# Patient Record
Sex: Male | Born: 1965 | Race: White | Hispanic: No | Marital: Married | Smoking: Former smoker
Health system: Southern US, Community
[De-identification: ages and names within clinical notes are randomized; demographics above are authoritative.]

## PROBLEM LIST (undated history)

## (undated) DIAGNOSIS — J45909 Unspecified asthma, uncomplicated: Secondary | ICD-10-CM

## (undated) DIAGNOSIS — J189 Pneumonia, unspecified organism: Secondary | ICD-10-CM

## (undated) DIAGNOSIS — Z8489 Family history of other specified conditions: Secondary | ICD-10-CM

## (undated) DIAGNOSIS — J329 Chronic sinusitis, unspecified: Secondary | ICD-10-CM

## (undated) DIAGNOSIS — K529 Noninfective gastroenteritis and colitis, unspecified: Secondary | ICD-10-CM

## (undated) DIAGNOSIS — E7800 Pure hypercholesterolemia, unspecified: Secondary | ICD-10-CM

## (undated) DIAGNOSIS — M1990 Unspecified osteoarthritis, unspecified site: Secondary | ICD-10-CM

## (undated) DIAGNOSIS — G8929 Other chronic pain: Secondary | ICD-10-CM

## (undated) DIAGNOSIS — G4730 Sleep apnea, unspecified: Secondary | ICD-10-CM

## (undated) HISTORY — PX: TONSILLECTOMY: SUR1361

## (undated) HISTORY — PX: VASECTOMY: SHX75

## (undated) HISTORY — PX: BACK SURGERY: SHX140

## (undated) HISTORY — PX: NASAL SINUS SURGERY: SHX719

## (undated) HISTORY — PX: CERVICAL FUSION: SHX112

---

## 2014-06-05 ENCOUNTER — Other Ambulatory Visit: Payer: Self-pay | Admitting: Neurosurgery

## 2014-07-07 ENCOUNTER — Encounter (HOSPITAL_COMMUNITY): Payer: Self-pay

## 2014-07-07 ENCOUNTER — Encounter (HOSPITAL_COMMUNITY)
Admission: RE | Admit: 2014-07-07 | Discharge: 2014-07-07 | Disposition: A | Discharge status: Home / Self Care | Payer: Managed Care, Other (non HMO) | Source: Ambulatory Visit | Attending: Neurosurgery | Admitting: Neurosurgery

## 2014-07-07 DIAGNOSIS — Z01812 Encounter for preprocedural laboratory examination: Secondary | ICD-10-CM | POA: Insufficient documentation

## 2014-07-07 DIAGNOSIS — Z01818 Encounter for other preprocedural examination: Secondary | ICD-10-CM | POA: Insufficient documentation

## 2014-07-07 HISTORY — DX: Pure hypercholesterolemia, unspecified: E78.00

## 2014-07-07 HISTORY — DX: Family history of other specified conditions: Z84.89

## 2014-07-07 HISTORY — DX: Noninfective gastroenteritis and colitis, unspecified: K52.9

## 2014-07-07 HISTORY — DX: Pneumonia, unspecified organism: J18.9

## 2014-07-07 HISTORY — DX: Unspecified asthma, uncomplicated: J45.909

## 2014-07-07 HISTORY — DX: Chronic sinusitis, unspecified: J32.9

## 2014-07-07 HISTORY — DX: Other chronic pain: G89.29

## 2014-07-07 HISTORY — DX: Sleep apnea, unspecified: G47.30

## 2014-07-07 HISTORY — DX: Unspecified osteoarthritis, unspecified site: M19.90

## 2014-07-07 LAB — CBC
HCT: 39.3 % (ref 39.0–52.0)
Hemoglobin: 12.6 g/dL — ABNORMAL LOW (ref 13.0–17.0)
MCH: 28.6 pg (ref 26.0–34.0)
MCHC: 32.1 g/dL (ref 30.0–36.0)
MCV: 89.3 fL (ref 78.0–100.0)
Platelets: 220 10*3/uL (ref 150–400)
RBC: 4.4 MIL/uL (ref 4.22–5.81)
RDW: 13.7 % (ref 11.5–15.5)
WBC: 6.3 10*3/uL (ref 4.0–10.5)

## 2014-07-07 LAB — BASIC METABOLIC PANEL
ANION GAP: 12 (ref 5–15)
BUN: 8 mg/dL (ref 6–23)
CHLORIDE: 101 meq/L (ref 96–112)
CO2: 29 mEq/L (ref 19–32)
Calcium: 9.1 mg/dL (ref 8.4–10.5)
Creatinine, Ser: 0.74 mg/dL (ref 0.50–1.35)
GFR calc non Af Amer: >90 mL/min (ref >90)
Glucose, Bld: 100 mg/dL — ABNORMAL HIGH (ref 70–99)
POTASSIUM: 3.9 meq/L (ref 3.7–5.3)
SODIUM: 142 meq/L (ref 137–147)

## 2014-07-07 LAB — SURGICAL PCR SCREEN
MRSA, PCR: NEGATIVE
STAPHYLOCOCCUS AUREUS: NEGATIVE

## 2014-07-07 NOTE — Pre-Procedure Instructions (Signed)
Miguel Lam  07/07/2014   Your procedure is scheduled on: Thursday, July 20, 2014  [facility] Admitting at 5:30 AM.  Call this number if you have problems the morning of surgery: [phone]   Remember:   Do not eat food or drink liquids after midnight Wednesday, July 19, 2014   Take these medicines the morning of surgery with A SIP OF WATER: DULoxetine (CYMBALTA),  pregabalin (LYRICA),  Stop taking Aspirin, vitamins, and herbal medications. Do not take any NSAIDs ie: Ibuprofen, Advil, Naproxen or any medication containing Aspirin; stop 5 days prior to procedure ( Sat. 07/15/14 ).  Do not wear jewelry, make-up or nail polish.  Do not wear lotions, powders, or perfumes. You may wear deodorant.  Do not shave 48 hours prior to surgery. Men may shave face and neck.  Do not bring valuables to the hospital.  [facility] not [facility] any belongings or valuables.               Contacts, dentures or bridgework may not be worn into surgery.  Leave suitcase in the car. After surgery it may be brought to your room.  For patients admitted to the hospital, discharge time is determined by your treatment team.               Patients discharged the day of surgery will not be allowed to drive home.  Name and phone number of your driver:   Special Instructions:  Special Instructions:Special Instructions: [facility] - Preparing for [facility]  Before surgery, you can play an important role.  Because skin is not sterile, your skin needs to be as free of germs as possible.  You can reduce the number of germs on you skin by washing with CHG (chlorahexidine gluconate) soap before surgery.  CHG is an antiseptic cleaner which kills germs and bonds with the skin to continue killing germs even after washing.  Please DO NOT use if you have an allergy to CHG or antibacterial soaps.  If your skin becomes reddened/irritated stop using the CHG and inform your nurse when you arrive at  Short Stay.  Do not shave (including legs and underarms) for at least 48 hours prior to the first CHG shower.  You may shave your face.  Please follow these instructions carefully:   1.  Shower with CHG Soap the night before surgery and the morning of Surgery.  2.  If you choose to wash your hair, wash your hair first as usual with your normal shampoo.  3.  After you shampoo, rinse your hair and body thoroughly to remove the Shampoo.  4.  Use CHG as you would any other liquid soap.  You can apply chg directly  to the skin and wash gently with scrungie or a clean washcloth.  5.  Apply the CHG Soap to your body ONLY FROM THE NECK DOWN.  Do not use on open wounds or open sores.  Avoid contact with your eyes, ears, mouth and genitals (private parts).  Wash genitals (private parts) with your normal soap.  6.  Wash thoroughly, paying special attention to the area where your surgery will be performed.  7.  Thoroughly rinse your body with warm water from the neck down.  8.  DO NOT shower/wash with your normal soap after using and rinsing off the CHG Soap.  9.  Pat yourself dry with a clean towel.  10.  Wear clean pajamas.            11.  Place clean sheets on your bed the night of your first shower and do not sleep with pets.  Day of Surgery  Do not apply any lotions the morning of surgery.  Please wear clean clothes to the hospital/surgery center.   Please read over the following fact sheets that you were given: Pain Booklet, Coughing and Deep Breathing, Blood Transfusion Information, MRSA Information and Surgical Site Infection Prevention

## 2014-07-07 NOTE — Progress Notes (Signed)
Pt denies SOB, chest pain, and being under the care of a cardiologist. Pt denies having a chest x ray and EKG within the last year. Pt denies having a stress test, echo and cardiac cath. Pt PCP ids Dr. Nickolas MadridSteven [place] in [place].

## 2014-07-19 MED ORDER — CEFAZOLIN SODIUM-DEXTROSE 2-3 GM-% IV SOLR
2.0000 g | INTRAVENOUS | Status: DC
  Filled 2014-07-19: qty 50

## 2014-07-19 MED ORDER — DEXAMETHASONE SODIUM PHOSPHATE 10 MG/ML IJ SOLN: 10.0000 mg | INTRAMUSCULAR | Status: DC

## 2014-07-20 ENCOUNTER — Encounter (HOSPITAL_COMMUNITY)
Admission: RE | Disposition: A | Discharge status: Home / Self Care | Payer: Self-pay | Source: Ambulatory Visit | Attending: Neurosurgery

## 2014-07-20 ENCOUNTER — Encounter (HOSPITAL_COMMUNITY): Payer: Self-pay | Admitting: *Deleted

## 2014-07-20 ENCOUNTER — Inpatient Hospital Stay (HOSPITAL_COMMUNITY): Payer: Managed Care, Other (non HMO) | Admitting: Anesthesiology

## 2014-07-20 ENCOUNTER — Encounter (HOSPITAL_COMMUNITY): Payer: Managed Care, Other (non HMO) | Admitting: Anesthesiology

## 2014-07-20 ENCOUNTER — Inpatient Hospital Stay (HOSPITAL_COMMUNITY)
Admission: RE | Admit: 2014-07-20 | Discharge: 2014-07-20 | DRG: 460 | Disposition: A | Discharge status: Home / Self Care | Payer: Managed Care, Other (non HMO) | Source: Ambulatory Visit | Attending: Neurosurgery | Admitting: Neurosurgery

## 2014-07-20 ENCOUNTER — Inpatient Hospital Stay (HOSPITAL_COMMUNITY): Payer: Managed Care, Other (non HMO)

## 2014-07-20 DIAGNOSIS — Z825 Family history of asthma and other chronic lower respiratory diseases: Secondary | ICD-10-CM

## 2014-07-20 DIAGNOSIS — G8929 Other chronic pain: Secondary | ICD-10-CM | POA: Diagnosis present

## 2014-07-20 DIAGNOSIS — M533 Sacrococcygeal disorders, not elsewhere classified: Principal | ICD-10-CM | POA: Diagnosis present

## 2014-07-20 DIAGNOSIS — Z9852 Vasectomy status: Secondary | ICD-10-CM

## 2014-07-20 DIAGNOSIS — Z87891 Personal history of nicotine dependence: Secondary | ICD-10-CM

## 2014-07-20 DIAGNOSIS — Z79899 Other long term (current) drug therapy: Secondary | ICD-10-CM

## 2014-07-20 DIAGNOSIS — G4730 Sleep apnea, unspecified: Secondary | ICD-10-CM | POA: Diagnosis present

## 2014-07-20 DIAGNOSIS — Z833 Family history of diabetes mellitus: Secondary | ICD-10-CM

## 2014-07-20 DIAGNOSIS — Z981 Arthrodesis status: Secondary | ICD-10-CM

## 2014-07-20 DIAGNOSIS — Z8249 Family history of ischemic heart disease and other diseases of the circulatory system: Secondary | ICD-10-CM

## 2014-07-20 DIAGNOSIS — E7800 Pure hypercholesterolemia, unspecified: Secondary | ICD-10-CM | POA: Diagnosis present

## 2014-07-20 HISTORY — PX: LUMBAR FUSION: SHX111

## 2014-07-20 SURGERY — LUMBAR FACET FUSION
Anesthesia: General | Site: Back | Laterality: Left

## 2014-07-20 MED ORDER — CYCLOBENZAPRINE HCL 10 MG PO TABS: 10.0000 mg | ORAL_TABLET | Freq: Three times a day (TID) | ORAL | Status: DC | PRN

## 2014-07-20 MED ORDER — HYDROMORPHONE HCL PF 1 MG/ML IJ SOLN
INTRAMUSCULAR | Status: AC
  Filled 2014-07-20: qty 1

## 2014-07-20 MED ORDER — HYDROMORPHONE HCL PF 1 MG/ML IJ SOLN: 1.0000 mg | INTRAMUSCULAR | Status: DC | PRN

## 2014-07-20 MED ORDER — ROCURONIUM BROMIDE 50 MG/5ML IV SOLN
INTRAVENOUS | Status: AC
  Filled 2014-07-20: qty 1

## 2014-07-20 MED ORDER — LIDOCAINE HCL (CARDIAC) 20 MG/ML IV SOLN
INTRAVENOUS | Status: AC
  Filled 2014-07-20: qty 5

## 2014-07-20 MED ORDER — CYCLOBENZAPRINE HCL 10 MG PO TABS: 10.0000 mg | ORAL_TABLET | Freq: Three times a day (TID) | ORAL | Status: AC | PRN

## 2014-07-20 MED ORDER — DEXMEDETOMIDINE HCL IN NACL 200 MCG/50ML IV SOLN
INTRAVENOUS | Status: DC | PRN
  Administered 2014-07-20: 0.3 ug/kg/h via INTRAVENOUS

## 2014-07-20 MED ORDER — PREGABALIN 100 MG PO CAPS: 300.0000 mg | ORAL_CAPSULE | Freq: Every day | ORAL | Status: DC

## 2014-07-20 MED ORDER — OXYCODONE-ACETAMINOPHEN 5-325 MG PO TABS
1.0000 | ORAL_TABLET | ORAL | Status: DC | PRN
Start: 2014-07-20 — End: 2014-07-20
  Administered 2014-07-20: 2 via ORAL
  Filled 2014-07-20: qty 2

## 2014-07-20 MED ORDER — CYCLOBENZAPRINE HCL 10 MG PO TABS
10.0000 mg | ORAL_TABLET | Freq: Three times a day (TID) | ORAL | Status: DC | PRN
  Administered 2014-07-20: 10 mg via ORAL
  Filled 2014-07-20: qty 1

## 2014-07-20 MED ORDER — DEXMEDETOMIDINE HCL IN NACL 200 MCG/50ML IV SOLN
INTRAVENOUS | Status: AC
  Filled 2014-07-20: qty 50

## 2014-07-20 MED ORDER — ACETAMINOPHEN 325 MG PO TABS: 650.0000 mg | ORAL_TABLET | ORAL | Status: DC | PRN

## 2014-07-20 MED ORDER — PANTOPRAZOLE SODIUM 40 MG IV SOLR
40.0000 mg | Freq: Every day | INTRAVENOUS | Status: DC
  Filled 2014-07-20: qty 40

## 2014-07-20 MED ORDER — OXYCODONE-ACETAMINOPHEN 5-325 MG PO TABS: 1.0000 | ORAL_TABLET | ORAL | Status: AC | PRN

## 2014-07-20 MED ORDER — NEOSTIGMINE METHYLSULFATE 10 MG/10ML IV SOLN
INTRAVENOUS | Status: AC
  Filled 2014-07-20: qty 1

## 2014-07-20 MED ORDER — GLYCOPYRROLATE 0.2 MG/ML IJ SOLN
INTRAMUSCULAR | Status: AC
  Filled 2014-07-20: qty 2

## 2014-07-20 MED ORDER — HYDROMORPHONE HCL PF 1 MG/ML IJ SOLN
0.2500 mg | INTRAMUSCULAR | Status: DC | PRN
  Administered 2014-07-20: 0.5 mg via INTRAVENOUS

## 2014-07-20 MED ORDER — KCL IN DEXTROSE-NACL 20-5-0.45 MEQ/L-%-% IV SOLN
80.0000 mL/h | INTRAVENOUS | Status: DC
  Filled 2014-07-20 (×2): qty 1000

## 2014-07-20 MED ORDER — PROPOFOL 10 MG/ML IV BOLUS
INTRAVENOUS | Status: AC
  Filled 2014-07-20: qty 20

## 2014-07-20 MED ORDER — ONDANSETRON HCL 4 MG/2ML IJ SOLN
INTRAMUSCULAR | Status: AC
  Filled 2014-07-20: qty 2

## 2014-07-20 MED ORDER — THROMBIN 5000 UNITS EX SOLR
CUTANEOUS | Status: DC | PRN
  Administered 2014-07-20 (×2): 5000 [IU] via TOPICAL

## 2014-07-20 MED ORDER — OXYCODONE HCL 5 MG PO TABS: 5.0000 mg | ORAL_TABLET | Freq: Once | ORAL | Status: DC | PRN

## 2014-07-20 MED ORDER — MENTHOL 3 MG MT LOZG: 1.0000 | LOZENGE | OROMUCOSAL | Status: DC | PRN

## 2014-07-20 MED ORDER — HEMOSTATIC AGENTS (NO CHARGE) OPTIME
TOPICAL | Status: DC | PRN
  Administered 2014-07-20: 1 via TOPICAL

## 2014-07-20 MED ORDER — DEXAMETHASONE SODIUM PHOSPHATE 4 MG/ML IJ SOLN
INTRAMUSCULAR | Status: AC
  Filled 2014-07-20: qty 3

## 2014-07-20 MED ORDER — CEFAZOLIN SODIUM-DEXTROSE 2-3 GM-% IV SOLR
2.0000 g | Freq: Three times a day (TID) | INTRAVENOUS | Status: DC
  Administered 2014-07-20: 2 g via INTRAVENOUS
  Filled 2014-07-20 (×2): qty 50

## 2014-07-20 MED ORDER — PHENOL 1.4 % MT LIQD: 1.0000 | OROMUCOSAL | Status: DC | PRN

## 2014-07-20 MED ORDER — OXYCODONE-ACETAMINOPHEN 5-325 MG PO TABS: 1.0000 | ORAL_TABLET | ORAL | Status: DC | PRN

## 2014-07-20 MED ORDER — FENTANYL 25 MCG/HR TD PT72: 100.0000 ug | MEDICATED_PATCH | TRANSDERMAL | Status: DC

## 2014-07-20 MED ORDER — FENTANYL CITRATE 0.05 MG/ML IJ SOLN
INTRAMUSCULAR | Status: DC | PRN
  Administered 2014-07-20: 100 ug via INTRAVENOUS

## 2014-07-20 MED ORDER — SODIUM CHLORIDE 0.9 % IJ SOLN
3.0000 mL | Freq: Two times a day (BID) | INTRAMUSCULAR | Status: DC
  Administered 2014-07-20: 3 mL via INTRAVENOUS

## 2014-07-20 MED ORDER — LACTATED RINGERS IV SOLN
INTRAVENOUS | Status: DC | PRN
  Administered 2014-07-20: 07:00:00 via INTRAVENOUS

## 2014-07-20 MED ORDER — SODIUM CHLORIDE 0.9 % IR SOLN
Status: DC | PRN
  Administered 2014-07-20: 08:00:00

## 2014-07-20 MED ORDER — BUPIVACAINE HCL (PF) 0.25 % IJ SOLN
INTRAMUSCULAR | Status: DC | PRN
  Administered 2014-07-20: 20 mL

## 2014-07-20 MED ORDER — MIDAZOLAM HCL 5 MG/5ML IJ SOLN
INTRAMUSCULAR | Status: DC | PRN
  Administered 2014-07-20: 2 mg via INTRAVENOUS

## 2014-07-20 MED ORDER — ACETAMINOPHEN 650 MG RE SUPP: 650.0000 mg | RECTAL | Status: DC | PRN

## 2014-07-20 MED ORDER — TRAZODONE HCL 100 MG PO TABS
100.0000 mg | ORAL_TABLET | Freq: Every day | ORAL | Status: DC
  Filled 2014-07-20: qty 1

## 2014-07-20 MED ORDER — ONDANSETRON HCL 4 MG/2ML IJ SOLN: 4.0000 mg | Freq: Once | INTRAMUSCULAR | Status: DC | PRN

## 2014-07-20 MED ORDER — DEXAMETHASONE SODIUM PHOSPHATE 4 MG/ML IJ SOLN
INTRAMUSCULAR | Status: AC
  Filled 2014-07-20: qty 2

## 2014-07-20 MED ORDER — ONDANSETRON HCL 4 MG/2ML IJ SOLN
INTRAMUSCULAR | Status: DC | PRN
  Administered 2014-07-20: 4 mg via INTRAVENOUS

## 2014-07-20 MED ORDER — DULOXETINE HCL 60 MG PO CPEP
60.0000 mg | ORAL_CAPSULE | Freq: Two times a day (BID) | ORAL | Status: DC
  Filled 2014-07-20: qty 1

## 2014-07-20 MED ORDER — DEXAMETHASONE SODIUM PHOSPHATE 4 MG/ML IJ SOLN
INTRAMUSCULAR | Status: DC | PRN
  Administered 2014-07-20: 8 mg via INTRAVENOUS

## 2014-07-20 MED ORDER — ONDANSETRON HCL 4 MG/2ML IJ SOLN: 4.0000 mg | INTRAMUSCULAR | Status: DC | PRN

## 2014-07-20 MED ORDER — KETAMINE HCL 100 MG/ML IJ SOLN
INTRAMUSCULAR | Status: DC | PRN
  Administered 2014-07-20: 20 mg via INTRAVENOUS

## 2014-07-20 MED ORDER — NEOSTIGMINE METHYLSULFATE 10 MG/10ML IV SOLN
INTRAVENOUS | Status: DC | PRN
  Administered 2014-07-20: 3 mg via INTRAVENOUS

## 2014-07-20 MED ORDER — FENTANYL CITRATE 0.05 MG/ML IJ SOLN
INTRAMUSCULAR | Status: AC
  Filled 2014-07-20: qty 5

## 2014-07-20 MED ORDER — KETAMINE HCL 100 MG/ML IJ SOLN
INTRAMUSCULAR | Status: AC
  Filled 2014-07-20: qty 1

## 2014-07-20 MED ORDER — DOCUSATE SODIUM 100 MG PO CAPS
100.0000 mg | ORAL_CAPSULE | Freq: Two times a day (BID) | ORAL | Status: DC
  Filled 2014-07-20 (×2): qty 1

## 2014-07-20 MED ORDER — OXYCODONE HCL 5 MG/5ML PO SOLN
5.0000 mg | Freq: Once | ORAL | Status: DC | PRN
Start: 2014-07-20 — End: 2014-07-20

## 2014-07-20 MED ORDER — GLYCOPYRROLATE 0.2 MG/ML IJ SOLN
INTRAMUSCULAR | Status: DC | PRN
  Administered 2014-07-20: 0.4 mg via INTRAVENOUS

## 2014-07-20 MED ORDER — FLUTICASONE PROPIONATE 50 MCG/ACT NA SUSP
2.0000 | Freq: Every day | NASAL | Status: DC | PRN
  Filled 2014-07-20: qty 16

## 2014-07-20 MED ORDER — LIDOCAINE HCL (CARDIAC) 20 MG/ML IV SOLN
INTRAVENOUS | Status: DC | PRN
  Administered 2014-07-20: 70 mg via INTRAVENOUS

## 2014-07-20 MED ORDER — ROCURONIUM BROMIDE 100 MG/10ML IV SOLN
INTRAVENOUS | Status: DC | PRN
  Administered 2014-07-20: 50 mg via INTRAVENOUS

## 2014-07-20 MED ORDER — PROPOFOL 10 MG/ML IV BOLUS
INTRAVENOUS | Status: DC | PRN
  Administered 2014-07-20: 160 mg via INTRAVENOUS

## 2014-07-20 MED ORDER — SODIUM CHLORIDE 0.9 % IJ SOLN: 3.0000 mL | INTRAMUSCULAR | Status: DC | PRN

## 2014-07-20 MED ORDER — MIDAZOLAM HCL 2 MG/2ML IJ SOLN
INTRAMUSCULAR | Status: AC
  Filled 2014-07-20: qty 2

## 2014-07-20 MED ORDER — 0.9 % SODIUM CHLORIDE (POUR BTL) OPTIME
TOPICAL | Status: DC | PRN
  Administered 2014-07-20: 1000 mL

## 2014-07-20 SURGICAL SUPPLY — 60 items
BAG DECANTER FOR FLEXI CONT (MISCELLANEOUS) ×3 IMPLANT
BENZOIN TINCTURE PRP APPL 2/3 (GAUZE/BANDAGES/DRESSINGS) ×6 IMPLANT
BLADE 10 SAFETY STRL DISP (BLADE) ×3 IMPLANT
BLADE SURG ROTATE 9660 (MISCELLANEOUS) IMPLANT
BRUSH SCRUB EZ PLAIN DRY (MISCELLANEOUS) ×3 IMPLANT
CLOSURE WOUND 1/2 X4 (GAUZE/BANDAGES/DRESSINGS) ×1
CONT SPEC 4OZ CLIKSEAL STRL BL (MISCELLANEOUS) IMPLANT
COVER BACK TABLE 24X17X13 BIG (DRAPES) IMPLANT
COVER TABLE BACK 60X90 (DRAPES) ×3 IMPLANT
DERMABOND ADVANCED (GAUZE/BANDAGES/DRESSINGS) ×2
DERMABOND ADVANCED .7 DNX12 (GAUZE/BANDAGES/DRESSINGS) ×1 IMPLANT
DRAPE C-ARM 42X72 X-RAY (DRAPES) ×3 IMPLANT
DRAPE C-ARMOR (DRAPES) ×3 IMPLANT
DRAPE LAPAROTOMY 100X72X124 (DRAPES) ×3 IMPLANT
DRAPE SURG 17X23 STRL (DRAPES) ×6 IMPLANT
DRSG OPSITE POSTOP 4X6 (GAUZE/BANDAGES/DRESSINGS) ×3 IMPLANT
DRSG TELFA 3X8 NADH (GAUZE/BANDAGES/DRESSINGS) ×3 IMPLANT
ELECT BLADE 4.0 EZ CLEAN MEGAD (MISCELLANEOUS) ×3
ELECT REM PT RETURN 9FT ADLT (ELECTROSURGICAL) ×3
ELECTRODE BLDE 4.0 EZ CLN MEGD (MISCELLANEOUS) ×1 IMPLANT
ELECTRODE REM PT RTRN 9FT ADLT (ELECTROSURGICAL) ×1 IMPLANT
GAUZE SPONGE 4X4 16PLY XRAY LF (GAUZE/BANDAGES/DRESSINGS) IMPLANT
GLOVE BIOGEL PI IND STRL 7.0 (GLOVE) ×2 IMPLANT
GLOVE BIOGEL PI IND STRL 7.5 (GLOVE) ×1 IMPLANT
GLOVE BIOGEL PI INDICATOR 7.0 (GLOVE) ×4
GLOVE BIOGEL PI INDICATOR 7.5 (GLOVE) ×2
GLOVE ECLIPSE 7.0 STRL STRAW (GLOVE) ×3 IMPLANT
GLOVE ECLIPSE 8.0 STRL XLNG CF (GLOVE) ×3 IMPLANT
GLOVE EXAM NITRILE LRG STRL (GLOVE) IMPLANT
GLOVE EXAM NITRILE XL STR (GLOVE) IMPLANT
GLOVE EXAM NITRILE XS STR PU (GLOVE) IMPLANT
GLOVE OPTIFIT SS 6.5 STRL BRWN (GLOVE) ×9 IMPLANT
GLOVE SS N UNI LF 7.0 STRL (GLOVE) ×9 IMPLANT
GOWN STRL REUS W/ TWL LRG LVL3 (GOWN DISPOSABLE) ×4 IMPLANT
GOWN STRL REUS W/ TWL XL LVL3 (GOWN DISPOSABLE) ×1 IMPLANT
GOWN STRL REUS W/TWL 2XL LVL3 (GOWN DISPOSABLE) IMPLANT
GOWN STRL REUS W/TWL LRG LVL3 (GOWN DISPOSABLE) ×8
GOWN STRL REUS W/TWL XL LVL3 (GOWN DISPOSABLE) ×2
KIT BASIN OR (CUSTOM PROCEDURE TRAY) ×3 IMPLANT
KIT ROOM TURNOVER OR (KITS) ×3 IMPLANT
NEEDLE HYPO 22GX1.5 SAFETY (NEEDLE) ×3 IMPLANT
NS IRRIG 1000ML POUR BTL (IV SOLUTION) ×3 IMPLANT
PACK LAMINECTOMY NEURO (CUSTOM PROCEDURE TRAY) ×3 IMPLANT
PIN EXCHANGE 500MM (PIN) ×3 IMPLANT
PIN STEINMAN TROCAR 300MM (PIN) ×3 IMPLANT
PUTTY BONE GRAFT KIT 2.5ML (Bone Implant) ×3 IMPLANT
SCREW CANN FUS SI JT 12.5X50 (Screw) ×3 IMPLANT
SCREW CANN FUS SI JT 7X40 (Screw) ×3 IMPLANT
SCREW CANN SI JT FUS 12.5X45 (Screw) ×3 IMPLANT
SPONGE GAUZE 4X4 12PLY (GAUZE/BANDAGES/DRESSINGS) ×3 IMPLANT
SPONGE LAP 4X18 X RAY DECT (DISPOSABLE) IMPLANT
SPONGE SURGIFOAM ABS GEL SZ50 (HEMOSTASIS) IMPLANT
STRIP CLOSURE SKIN 1/2X4 (GAUZE/BANDAGES/DRESSINGS) ×2 IMPLANT
SUT VIC AB 2-0 OS6 18 (SUTURE) ×21 IMPLANT
SUT VIC AB 3-0 CP2 18 (SUTURE) ×9 IMPLANT
SYR 20ML ECCENTRIC (SYRINGE) ×3 IMPLANT
TOWEL OR 17X24 6PK STRL BLUE (TOWEL DISPOSABLE) ×3 IMPLANT
TOWEL OR 17X26 10 PK STRL BLUE (TOWEL DISPOSABLE) ×3 IMPLANT
TRAY FOLEY CATH 14FRSI W/METER (CATHETERS) IMPLANT
WATER STERILE IRR 1000ML POUR (IV SOLUTION) ×3 IMPLANT

## 2014-07-20 NOTE — Op Note (Signed)
Preop diagnosis: Left sacroiliac joint dysfunction Postop diagnosis: Same Procedure: Left sacroiliac joint fusion with TriCor system Surgeon: Fernie Grimm  After and placed in the prone position the patient's left buttock region was prepped and draped in the usual sterile fashion. Fluoroscopy B. was used to identify the posterior aspect of the sacrum and the alar line. We then made an incision parallel with the posterior aspect of the sacrum about 1 cm below it and starting 1 cm behind the alar line. Hemostasis was controlled unipolar coagulation. We then placed our first Steinmann pin and engaged the ilium. We followed it into good position and lateral inlet and outlet fluoroscopic views until was in excellent position. We then did sequential dilation through the soft tissue and then drilled a canal over the Steinmann pin. We then tapped and then placed a 45 mm both filled with a mixture of autologous bone morselized allograft. We removed the dilators then used our Steinmann pin to guide us to her second entry point we placed a second Steinmann pin without difficulty. We repeated the process at this level and placed a 50 mm both filled with a mixture of autologous bone and morselized allograft. This once again followed in excellent position and lateral inlet and outlet view fluoroscopy. We then placed our smaller bolt in standard fashion and used a 40 mm length graft for this. Final fluoroscopy in inlet and outlet views and lateral showed excellent placement of the instrumentation. We irrigated the wound copiously and closed it in multiple layers of Vicryl on the subcutaneous and subcuticular tissues. Dermabond Steri-Strips were used on the skin. A sterile dressing was then applied and the patient was extubated and taken to cut them in stable condition.

## 2014-07-20 NOTE — Discharge Instructions (Signed)
Wound Care Keep incision covered and dry for one week.. You may remove outer bandage after one week.  Do not put any creams, lotions, or ointments on incision. Leave steri-strips on hip.  They will fall off by themselves. Activity Walk each and every day, increasing distance each day. No lifting greater than 5 lbs.  Avoid bending, arching, and twisting. No driving or riding in car until further notice at follow up appointment.  Diet Resume your normal diet.  Return to Work Will be discussed at you follow up appointment. Call Your Doctor If Any of These Occur Redness, drainage, or swelling at the wound.  Temperature greater than 101 degrees. Severe pain not relieved by pain medication. Incision starts to come apart. Follow Up Appt Call today for appointment in 1-2 weeks [phone]) or for problems.  If you have any hardware placed in your spine, you will need an x-ray before your appointment.

## 2014-07-20 NOTE — Transfer of Care (Signed)
Immediate Anesthesia Transfer of Care Note  Patient: Miguel DavenportMichael G Lam  Procedure(s) Performed: Procedure(s) with comments: SI Joint Fusion (Left) - SI Joint Fusion  Patient Location: PACU  Anesthesia Type:General  Level of Consciousness: awake, alert , oriented and patient cooperative  Airway & Oxygen Therapy: Patient Spontanous Breathing and Patient connected to nasal cannula oxygen  Post-op Assessment: Report given to PACU RN and Post -op Vital signs reviewed and stable  Post vital signs: Reviewed and stable  Complications: No apparent anesthesia complications

## 2014-07-20 NOTE — Plan of Care (Signed)
Problem: Consults Goal: Diagnosis - Spinal Surgery Outcome: Completed/Met Date Met:  07/20/14 SACROILIAC JOINT FUSION

## 2014-07-20 NOTE — H&P (Signed)
Miguel Lam is an 48 y.o. male.   Chief Complaint: Left lower back and buttock pain HPI: The patient is a 48 year old gentleman who was evaluated in the office for left lower back and buttock pain. He's had problems for years. 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 He got some improvement but still had this pain. It is persistent and progressively worse during the last year. His wrist is asymptomatic. An MRI scan was done which was unremarkable and now comes for evaluation for possible SI joint dysfunction. He is a focal injections including radiofrequency broad-based. After evaluation in the office the patient underwent SI joint injection which gave him temporary relief only. This was actually done twice with the same response. After discussing the options the patient requested surgery and now comes for an SI joint fusion on the left side. I've had a long discussion with him regarding the risks and benefits of surgical intervention. The risks discussed include but are not limited to bleeding infection weakness numbness paralysis trouble with instrumentation coma and death. We've discussed alternative methods of therapy although risks and benefits of nonintervention. He's had the opportunity to ask numerous questions and appears to understand. With this information in hand he has requested that we proceed with surgery.  Past Medical History  Diagnosis Date  . Family history of anesthesia complication     "Had trouble waking grandmother up.'  . Chronic pain   . Colitis     PMH  . Hypercholesterolemia   . Asthma     "as a child"  . Recurrent sinus infections   . Sleep apnea   . Pneumonia   . Arthritis     Past Surgical History  Procedure Laterality Date  . Tonsillectomy    . Vasectomy    . Cervical fusion    . Nasal sinus surgery    . Back surgery      Family History  Problem Relation Age of Onset  . Heart disease Father   . Asthma Other   . Cancer - Colon Other   .  Diabetes Other   . Obesity Other    Social History:  reports that he has quit smoking. His smoking use included Cigarettes. He smoked 0.00 packs per day. He has never used smokeless tobacco. He reports that he does not drink alcohol or use illicit drugs.  Allergies: Not on File  Medications Prior to Admission  Medication Sig Dispense Refill  . DULoxetine (CYMBALTA) 60 MG capsule Take 60 mg by mouth 2 (two) times daily. For 90 days      . fentaNYL (DURAGESIC - DOSED MCG/HR) 100 MCG/HR Place 100 mcg onto the skin every other day.      . fluticasone (FLONASE) 50 MCG/ACT nasal spray Place 2 sprays into both nostrils daily as needed for allergies or rhinitis.      . Niacin, Antihyperlipidemic, (NIASPAN PO) Take 1 tablet by mouth daily.       . pregabalin (LYRICA) 100 MG capsule Take 300 mg by mouth daily.      . pregabalin (LYRICA) 150 MG capsule Take 150 mg by mouth 2 (two) times daily.      . simvastatin (ZOCOR) 20 MG tablet Take 20 mg by mouth daily at 6 PM.      . Testosterone (ANDROGEL PUMP TD) Place 1 application onto the skin daily. Per shoulder      . tiZANidine (ZANAFLEX) 4 MG tablet Take 4 mg by mouth 4 (four)  times daily.      . traZODone (DESYREL) 100 MG tablet Take 100 mg by mouth at bedtime. For 30 days      . diphenhydramine-acetaminophen (TYLENOL PM) 25-500 MG TABS Take 2 tablets by mouth at bedtime as needed (for sleep and pain).        No results found for this or any previous visit (from the past 48 hour(s)). No results found.  Positive for sinus issues  Blood pressure 111/70, pulse 53, temperature 98.6 F (37 C), temperature source Oral, resp. rate 18, weight 81.194 kg (179 lb), SpO2 97.00%.  The patient is awake alert and oriented. His no facial asymmetry. His 3+ knee jerks and 2+ ankle jerks. Strength is 5 over 5 and sensation is intact. He has positive tenderness over the left SI joint as well as positive provocative testing with compression of the knee down to the SI  joint. Assessment/Plan Impression is that of left SI joint dysfunction. The plan is for a left SI joint fusion.  Reinaldo MeekerKRITZER,Masyn Fullam O, MD 07/20/2014, 7:32 AM

## 2014-07-20 NOTE — Anesthesia Preprocedure Evaluation (Signed)
Anesthesia Evaluation  Patient identified by MRN, date of birth, ID band Patient awake    Reviewed: Allergy & Precautions, H&P , NPO status , Patient's Chart, lab work & pertinent test results  Airway Mallampati: II TM Distance: >3 FB Neck ROM: Full    Dental  (+) Teeth Intact, Dental Advisory Given   Pulmonary former smoker,  breath sounds clear to auscultation        Cardiovascular Rhythm:Regular Rate:Normal     Neuro/Psych    GI/Hepatic   Endo/Other    Renal/GU      Musculoskeletal   Abdominal   Peds  Hematology   Anesthesia Other Findings   Reproductive/Obstetrics                           Anesthesia Physical Anesthesia Plan  ASA: II  Anesthesia Plan: General   Post-op Pain Management:    Induction: Intravenous  Airway Management Planned: Oral ETT  Additional Equipment:   Intra-op Plan:   Post-operative Plan: Extubation in OR  Informed Consent: I have reviewed the patients History and Physical, chart, labs and discussed the procedure including the risks, benefits and alternatives for the proposed anesthesia with the patient or authorized representative who has indicated his/her understanding and acceptance.   Dental advisory given  Plan Discussed with: CRNA and Anesthesiologist  Anesthesia Plan Comments: (Chronic low back pain on long term narcotics H/O Cervical spondylosis   Plan GA with oral ETT  Kipp Broodavid Arek Spadafore, MD)        Anesthesia Quick Evaluation

## 2014-07-20 NOTE — Progress Notes (Signed)
Orthopedic Tech Progress Note Patient Details:  Miguel DavenportMichael G Lam 11/08/1966 [id number]  Ortho Devices Type of Ortho Device: Crutches Ortho Device/Splint Interventions: Application   Shawnie PonsCammer, Miguel Lam 07/20/2014, 12:45 PM

## 2014-07-20 NOTE — Progress Notes (Signed)
Patient alert and oriented, mae's well, voiding adequate amount of urine, swallowing without difficulty, no c/o pain. Patient discharged home with family. Script and discharged instructions given to patient. Patient and family stated understanding of instructions given.  

## 2014-07-20 NOTE — Discharge Summary (Signed)
  Physician Discharge Summary  Patient ID: Miguel Lam MRN: [id number] DOB/AGE: [birth year] 48 y.o.  Admit date: 07/20/2014 Discharge date: 07/20/2014  Admission Diagnoses: SI joint dysfunction  Discharge Diagnoses: Same Active Problems:   Sacroiliac joint dysfunction   Discharged Condition: Stable  Hospital Course:  Mrs. Miguel DavenportMichael G Myhand is a 48 y.o. male electively admitted after uncomplicated left SI joint fusion. Postop he was ambulating with crutches and non-weight bearing on the left. He is voiding normally and tolerating diet.  Treatments: Surgery - Left SI joint fusion  Discharge Exam: Blood pressure 125/83, pulse 72, temperature 97.7 F (36.5 C), temperature source Oral, resp. rate 26, weight 81.194 kg (179 lb), SpO2 96.00%. Awake, alert, oriented Speech fluent, appropriate CN grossly intact 5/5 BUE/BLE Wound c/d/i  Follow-up: Follow-up in Dr. Trudee GripKritzer's office [facility] [phone]) in 2-3 weeks  Disposition: Home     Medication List         ANDROGEL PUMP TD  Place 1 application onto the skin daily. Per shoulder     cyclobenzaprine 10 MG tablet  Commonly known as:  FLEXERIL  Take 1 tablet (10 mg total) by mouth 3 (three) times daily as needed for muscle spasms.     diphenhydramine-acetaminophen 25-500 MG Tabs  Commonly known as:  TYLENOL PM  Take 2 tablets by mouth at bedtime as needed (for sleep and pain).     DULoxetine 60 MG capsule  Commonly known as:  CYMBALTA  Take 60 mg by mouth 2 (two) times daily. For 90 days     fentaNYL 100 MCG/HR  Commonly known as:  DURAGESIC - dosed mcg/hr  Place 100 mcg onto the skin every other day.     fluticasone 50 MCG/ACT nasal spray  Commonly known as:  FLONASE  Place 2 sprays into both nostrils daily as needed for allergies or rhinitis.     NIASPAN PO  Take 1 tablet by mouth daily.     oxyCODONE-acetaminophen 5-325 MG per tablet  Commonly known as:  PERCOCET/ROXICET  Take 1-2  tablets by mouth every 4 (four) hours as needed for moderate pain.     pregabalin 100 MG capsule  Commonly known as:  LYRICA  Take 300 mg by mouth daily.     pregabalin 150 MG capsule  Commonly known as:  LYRICA  Take 150 mg by mouth 2 (two) times daily.     simvastatin 20 MG tablet  Commonly known as:  ZOCOR  Take 20 mg by mouth daily at 6 PM.     tiZANidine 4 MG tablet  Commonly known as:  ZANAFLEX  Take 4 mg by mouth 4 (four) times daily.     traZODone 100 MG tablet  Commonly known as:  DESYREL  Take 100 mg by mouth at bedtime. For 30 days         Signed: Lisbeth RenshawUNDKUMAR, Cire Deyarmin, C 07/20/2014, 4:52 PM

## 2014-07-20 NOTE — Anesthesia Postprocedure Evaluation (Signed)
  Anesthesia Post-op Note  Patient: Miguel Lam  Procedure(s) Performed: Procedure(s) with comments: SI Joint Fusion (Left) - SI Joint Fusion  Patient Location: PACU  Anesthesia Type:General  Level of Consciousness: awake, alert  and oriented  Airway and Oxygen Therapy: Patient Spontanous Breathing and Patient connected to nasal cannula oxygen  Post-op Pain: mild  Post-op Assessment: Post-op Vital signs reviewed, Patient's Cardiovascular Status Stable, Respiratory Function Stable, Patent Airway and Pain level controlled  Post-op Vital Signs: stable  Last Vitals:  Filed Vitals:   07/20/14 1050  BP: 125/83  Pulse: 72  Temp: 36.5 C  Resp:     Complications: No apparent anesthesia complications

## 2014-07-24 ENCOUNTER — Encounter (HOSPITAL_COMMUNITY): Payer: Self-pay | Admitting: Neurosurgery

## 2014-07-31 ENCOUNTER — Encounter (HOSPITAL_COMMUNITY): Payer: Self-pay | Admitting: Neurosurgery

## 2015-05-15 IMAGING — RF DG C-ARM 61-120 MIN
1 series · 4 of 4 positions shown · IV contrast (agent unspecified)
Comparison: None.

CLINICAL DATA: Left SI joint effusion.

EXAM:
DG C-ARM 61-120 MIN; BILATERAL SACROILIAC JOINTS - 3+ VIEW
TECHNIQUE: Four spot fluoroscopic intraoperative films over the sacrum.
CONTRAST:  None.
FLUOROSCOPY TIME:  2 min 54 seconds.

[Series 1: run · 4 of 4 slices shown]
[im 1/4]
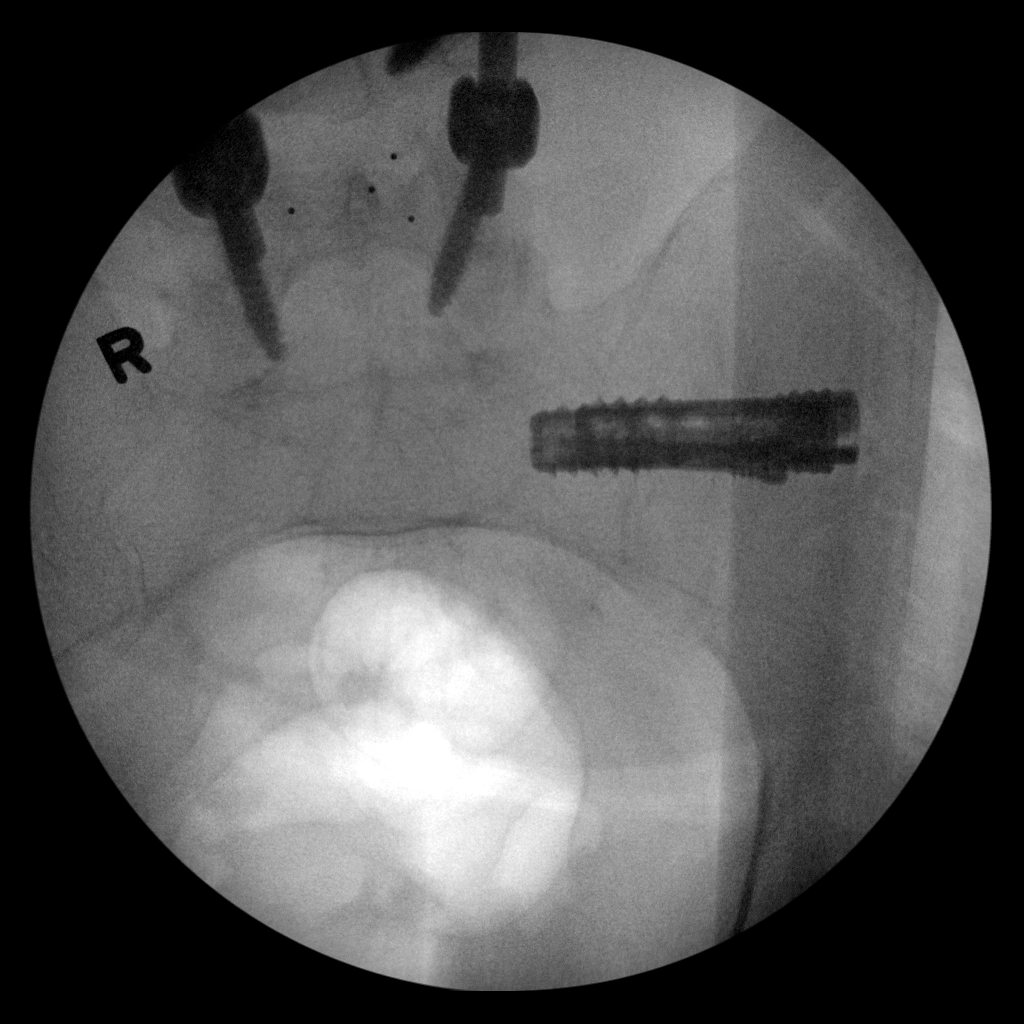
[im 2/4]
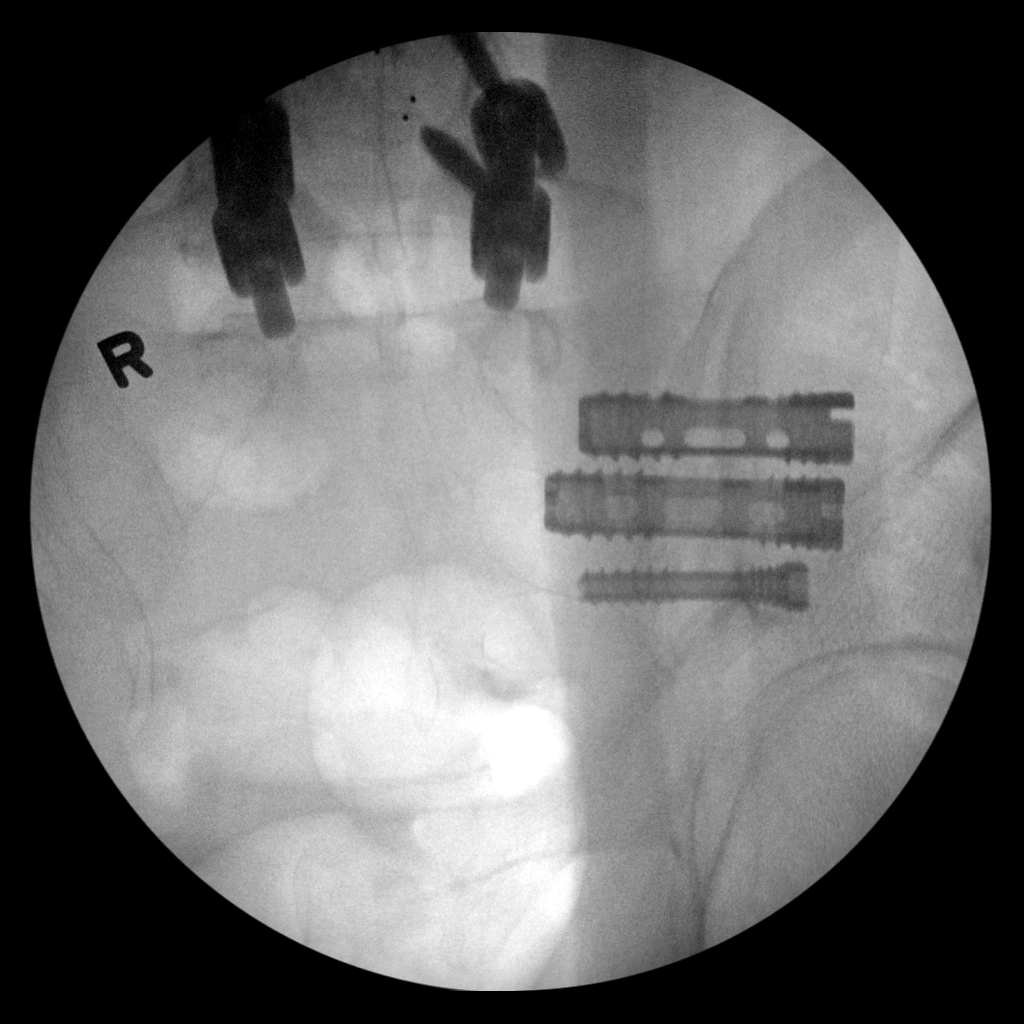
[im 3/4]
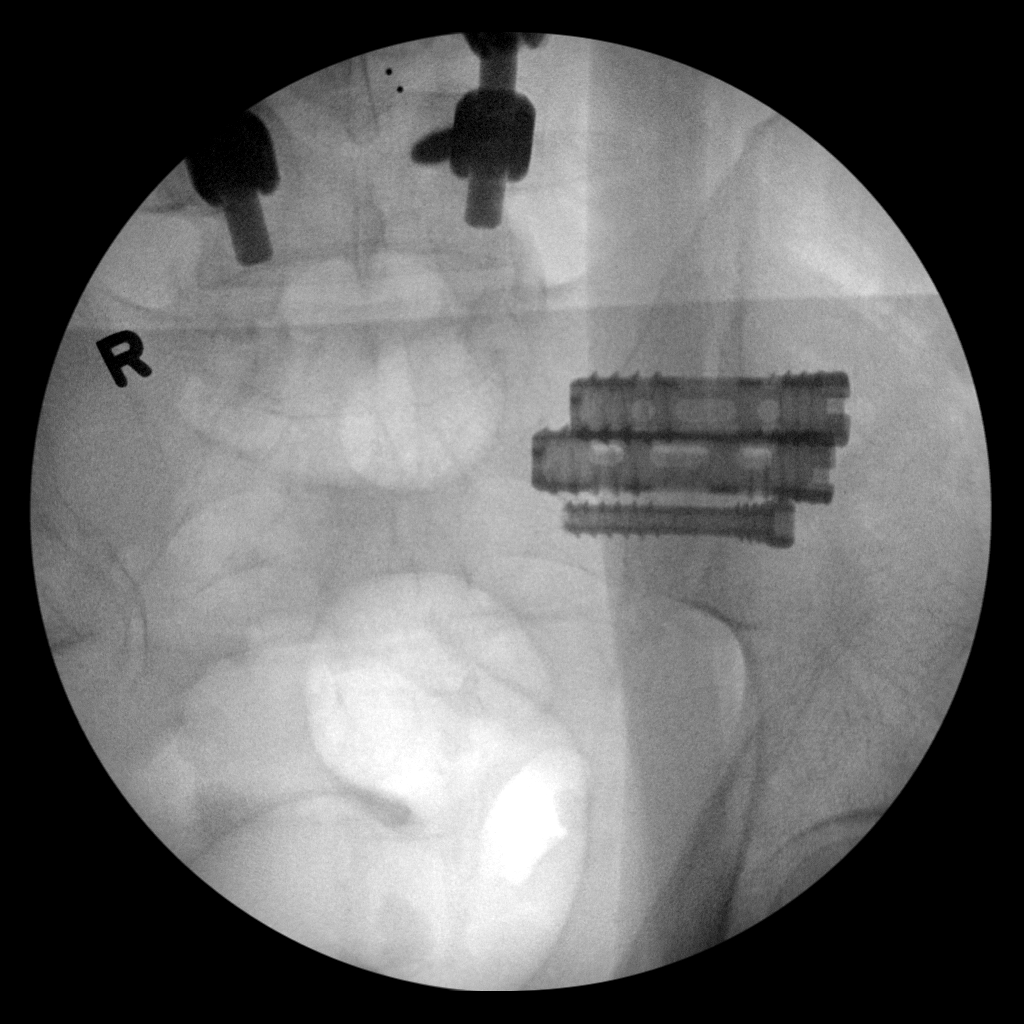
[im 4/4]
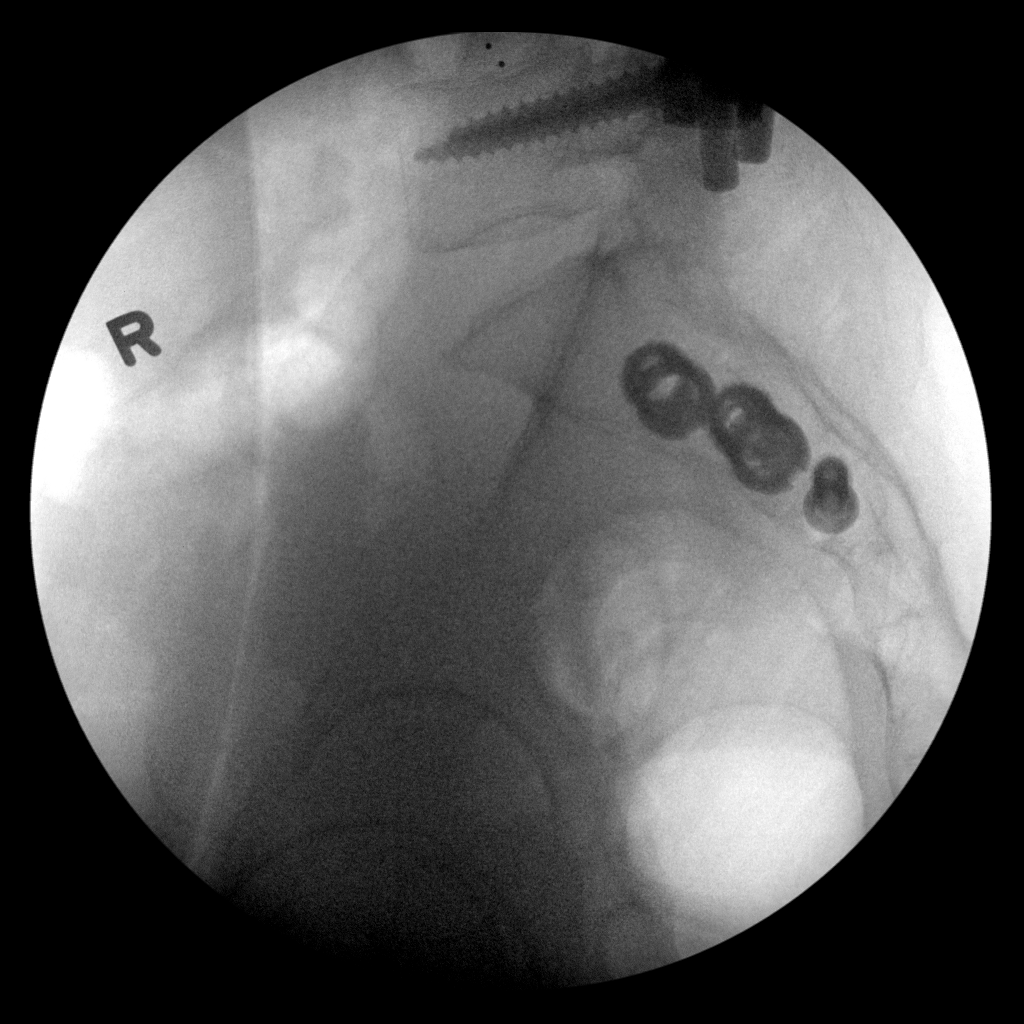

[4 of 4 positions shown; findings below may reference images not displayed]

FINDINGS: Fusion hardware is present in only partially visualized over the
lower lumbar spine. Three metallic devices are oriented horizontally
across the left sacroiliac joint for the purpose of SI joint fusion
and appear in adequate position.
IMPRESSION: Evidence of patient's recent left SI joint effusion. Recommend
correlation with findings at the time of the procedure.
